# Patient Record
Sex: Female | Born: 1995 | Race: Black or African American | Hispanic: No | Marital: Single | Smoking: Never smoker
Health system: Southern US, Community
[De-identification: ages and names within clinical notes are randomized; demographics above are authoritative.]

## PROBLEM LIST (undated history)

## (undated) ENCOUNTER — Inpatient Hospital Stay (HOSPITAL_COMMUNITY): Payer: Self-pay

## (undated) DIAGNOSIS — J45909 Unspecified asthma, uncomplicated: Secondary | ICD-10-CM

## (undated) DIAGNOSIS — D571 Sickle-cell disease without crisis: Secondary | ICD-10-CM

## (undated) HISTORY — PX: NO PAST SURGERIES: SHX2092

---

## 2015-12-15 ENCOUNTER — Encounter (HOSPITAL_COMMUNITY): Payer: Self-pay | Admitting: Emergency Medicine

## 2015-12-15 DIAGNOSIS — D5700 Hb-SS disease with crisis, unspecified: Secondary | ICD-10-CM | POA: Diagnosis not present

## 2015-12-15 DIAGNOSIS — J45909 Unspecified asthma, uncomplicated: Secondary | ICD-10-CM | POA: Diagnosis not present

## 2015-12-15 DIAGNOSIS — R52 Pain, unspecified: Secondary | ICD-10-CM | POA: Diagnosis present

## 2015-12-15 LAB — CBC WITH DIFFERENTIAL/PLATELET
Basophils Absolute: 0 10*3/uL (ref 0.0–0.1)
Basophils Relative: 0 %
EOS PCT: 1 %
Eosinophils Absolute: 0.1 10*3/uL (ref 0.0–0.7)
HCT: 32.5 % — ABNORMAL LOW (ref 36.0–46.0)
Hemoglobin: 11 g/dL — ABNORMAL LOW (ref 12.0–15.0)
LYMPHS ABS: 3.8 10*3/uL (ref 0.7–4.0)
LYMPHS PCT: 27 %
MCH: 26.4 pg (ref 26.0–34.0)
MCHC: 33.8 g/dL (ref 30.0–36.0)
MCV: 77.9 fL — AB (ref 78.0–100.0)
MONO ABS: 1.2 10*3/uL — AB (ref 0.1–1.0)
MONOS PCT: 8 %
Neutro Abs: 9.2 10*3/uL — ABNORMAL HIGH (ref 1.7–7.7)
Neutrophils Relative %: 64 %
PLATELETS: 198 10*3/uL (ref 150–400)
RBC: 4.17 MIL/uL (ref 3.87–5.11)
RDW: 16.5 % — AB (ref 11.5–15.5)
WBC: 14.3 10*3/uL — ABNORMAL HIGH (ref 4.0–10.5)

## 2015-12-15 LAB — COMPREHENSIVE METABOLIC PANEL
ALT: 41 U/L (ref 14–54)
AST: 39 U/L (ref 15–41)
Albumin: 3.8 g/dL (ref 3.5–5.0)
Alkaline Phosphatase: 70 U/L (ref 38–126)
Anion gap: 7 (ref 5–15)
BUN: 8 mg/dL (ref 6–20)
CHLORIDE: 108 mmol/L (ref 101–111)
CO2: 23 mmol/L (ref 22–32)
Calcium: 9.1 mg/dL (ref 8.9–10.3)
Creatinine, Ser: 0.86 mg/dL (ref 0.44–1.00)
GFR calc Af Amer: >60 mL/min (ref >60)
GFR calc non Af Amer: >60 mL/min (ref >60)
Glucose, Bld: 118 mg/dL — ABNORMAL HIGH (ref 65–99)
POTASSIUM: 3.4 mmol/L — AB (ref 3.5–5.1)
Sodium: 138 mmol/L (ref 135–145)
Total Bilirubin: 1.4 mg/dL — ABNORMAL HIGH (ref 0.3–1.2)
Total Protein: 7.2 g/dL (ref 6.5–8.1)

## 2015-12-15 NOTE — ED Notes (Signed)
No answer for vital sign recheck. 

## 2015-12-15 NOTE — ED Notes (Addendum)
Pt reports sickle cell pain crisis resulting in generalized pain. with an onset last Tuesday. Pt was treated at [facility] last Wednesday with minimal relief. Pt alert x4. NAD at this time.

## 2015-12-16 ENCOUNTER — Emergency Department (HOSPITAL_COMMUNITY)
Admission: EM | Admit: 2015-12-16 | Discharge: 2015-12-16 | Disposition: A | Discharge status: Home / Self Care | Payer: BLUE CROSS/BLUE SHIELD | Attending: Emergency Medicine | Admitting: Emergency Medicine

## 2015-12-16 HISTORY — DX: Unspecified asthma, uncomplicated: J45.909

## 2015-12-16 HISTORY — DX: Sickle-cell disease without crisis: D57.1

## 2015-12-16 NOTE — ED Notes (Signed)
No answer for room placement.

## 2018-08-12 ENCOUNTER — Encounter (HOSPITAL_COMMUNITY): Payer: Self-pay | Admitting: *Deleted

## 2018-08-12 ENCOUNTER — Inpatient Hospital Stay (HOSPITAL_COMMUNITY)
Admission: AD | Admit: 2018-08-12 | Discharge: 2018-08-13 | Disposition: A | Discharge status: Home / Self Care | Payer: BC Managed Care – PPO | Source: Ambulatory Visit | Attending: Obstetrics and Gynecology | Admitting: Obstetrics and Gynecology

## 2018-08-12 DIAGNOSIS — R102 Pelvic and perineal pain: Secondary | ICD-10-CM | POA: Diagnosis present

## 2018-08-12 DIAGNOSIS — O26891 Other specified pregnancy related conditions, first trimester: Secondary | ICD-10-CM | POA: Insufficient documentation

## 2018-08-12 DIAGNOSIS — O209 Hemorrhage in early pregnancy, unspecified: Secondary | ICD-10-CM | POA: Insufficient documentation

## 2018-08-12 DIAGNOSIS — Z3A08 8 weeks gestation of pregnancy: Secondary | ICD-10-CM | POA: Diagnosis not present

## 2018-08-12 LAB — POCT PREGNANCY, URINE: PREG TEST UR: POSITIVE — AB

## 2018-08-12 NOTE — MAU Note (Signed)
Cramping and bleeding since yesterday. Worse tonight. Feels like period cramps.

## 2018-08-13 ENCOUNTER — Inpatient Hospital Stay (HOSPITAL_COMMUNITY): Payer: BC Managed Care – PPO

## 2018-08-13 LAB — URINALYSIS, ROUTINE W REFLEX MICROSCOPIC
BACTERIA UA: NONE SEEN
Bilirubin Urine: NEGATIVE
Glucose, UA: NEGATIVE mg/dL
KETONES UR: NEGATIVE mg/dL
LEUKOCYTES UA: NEGATIVE
Nitrite: NEGATIVE
PROTEIN: NEGATIVE mg/dL
Specific Gravity, Urine: 1.006 (ref 1.005–1.030)
pH: 6 (ref 5.0–8.0)

## 2018-08-13 LAB — ABO/RH: ABO/RH(D): O POS

## 2018-08-13 MED ORDER — HYDROMORPHONE HCL 1 MG/ML IJ SOLN
1.0000 mg | Freq: Once | INTRAMUSCULAR | Status: AC
  Administered 2018-08-13: 1 mg via INTRAMUSCULAR
  Filled 2018-08-13: qty 1

## 2018-08-13 NOTE — Progress Notes (Signed)
Written and verbal d/c instructions given and understanding voiced. 

## 2018-08-13 NOTE — MAU Provider Note (Signed)
History     CSN: [id number]  Arrival date and time: 08/12/18 2304   First Provider Initiated Contact with Patient 08/13/18 0007      Chief Complaint  Patient presents with  . Abdominal Pain  . Vaginal Bleeding   HPI   [name] is a 22 y.o. female G1P0 @ 6061w1d by first trimester US here with abdominal pain and vaginal bleeding. Says she thinks her period has started because it looks and feels like that. The bleeding started today. The blood is bright red. Initially the bleeding was noted only when she wipes, now the bleeding is on a pad. She has lower abdominal cramping that comes and goes.   OB History    Gravida  1   Para      Term      Preterm      AB      Living        SAB      TAB      Ectopic      Multiple      Live Births              Past Medical History:  Diagnosis Date  . Asthma   . Sickle cell disease (HCC)     Past Surgical History:  Procedure Laterality Date  . NO PAST SURGERIES      History reviewed. No pertinent family history.  Social History   Tobacco Use  . Smoking status: Never Smoker  . Smokeless tobacco: Never Used  Substance Use Topics  . Alcohol use: No  . Drug use: No    Allergies: No Known Allergies  Medications Prior to Admission  Medication Sig Dispense Refill Last Dose  . Prenatal Vit-Fe Fumarate-FA (PRENATAL MULTIVITAMIN) TABS tablet Take 1 tablet by mouth daily at 12 noon.   08/11/2018 at Unknown time   Results for orders placed or performed during the hospital encounter of 08/12/18 (from the past 48 hour(s))  Urinalysis, Routine w reflex microscopic     Status: Abnormal   Collection Time: 08/12/18 11:51 PM  Result Value Ref Range   Color, Urine STRAW (A) YELLOW   APPearance CLEAR CLEAR   Specific Gravity, Urine 1.006 1.005 - 1.030   pH 6.0 5.0 - 8.0   Glucose, UA NEGATIVE NEGATIVE mg/dL   Hgb urine dipstick LARGE (A) NEGATIVE   Bilirubin Urine NEGATIVE NEGATIVE   Ketones, ur NEGATIVE  NEGATIVE mg/dL   Protein, ur NEGATIVE NEGATIVE mg/dL   Nitrite NEGATIVE NEGATIVE   Leukocytes, UA NEGATIVE NEGATIVE   RBC / HPF 0-5 0 - 5 RBC/hpf   Bacteria, UA NONE SEEN NONE SEEN   Mucus PRESENT     Comment: Performed at [facility], [address] [place], [place] [address]  Pregnancy, urine POC     Status: Abnormal   Collection Time: 08/12/18 11:54 PM  Result Value Ref Range   Preg Test, Ur POSITIVE (A) NEGATIVE    Comment:        THE SENSITIVITY OF THIS METHODOLOGY IS >24 mIU/mL    [place] Ob Less Than 14 Weeks With Ob Transvaginal  Result Date: 08/13/2018 CLINICAL DATA:  Acute onset of vaginal bleeding and pelvic cramping. EXAM: OBSTETRIC <14 WK US AND TRANSVAGINAL OB US TECHNIQUE: Both transabdominal and transvaginal ultrasound examinations were performed for complete evaluation of the gestation as well as the maternal uterus, adnexal regions, and pelvic cul-de-sac. Transvaginal technique was performed to assess early pregnancy. COMPARISON:  None. FINDINGS: Intrauterine gestational  sac: Single; visualized and normal in shape. Yolk sac:  Yes Embryo:  Yes Cardiac Activity: Yes Heart Rate: 171 bpm CRL: 18.6  mm   8 w   2 d                  US EDC: 03/23/2019 Subchorionic hemorrhage:  None visualized. Maternal uterus/adnexae: The uterus is otherwise unremarkable in appearance. The ovaries are within normal limits. There is no evidence for ovarian torsion. No suspicious adnexal masses are seen. A small amount of free fluid is seen within the pelvic cul-de-sac. IMPRESSION: Single live intrauterine pregnancy, with a crown-rump length of 1.9 cm, corresponding to a gestational age of [redacted] weeks 2 days. This matches the gestational age of [redacted] weeks 1 day by prior ultrasound, reflecting an estimated date of delivery of March 24, 2019. Electronically Signed   By: Roanna RaiderJeffery  Chang M.D.   On: 08/13/2018 01:29    Review of Systems  Constitutional: Negative for fever.  Gastrointestinal: Positive for  abdominal pain.  Genitourinary: Positive for vaginal bleeding. Negative for dysuria.   Physical Exam   Blood pressure (!) 116/52, pulse 89, temperature 98.3 F (36.8 C), resp. rate 18, height 5\' 3"  (1.6 m), weight 101.2 kg, last menstrual period 06/16/2018.  Physical Exam  Constitutional: She is oriented to person, place, and time. She appears well-developed and well-nourished. No distress.  GI: Soft. She exhibits no distension and no mass. There is no abdominal tenderness. There is no rebound and no guarding.  Genitourinary:    Genitourinary Comments: Cervix: Closed, posterior. Small amount of dark red blood noted on pad.    Musculoskeletal: Normal range of motion.  Neurological: She is alert and oriented to person, place, and time.  Skin: Skin is warm. She is not diaphoretic.  Psychiatric: Her behavior is normal.   MAU Course  Procedures  None  MDM  Attempted bedside US: unable to determine fetal heart rate due to body habitus.  Dilaudid given for pain IM  US ordered: + fetal heart rate  Discussed US results in detail ABO: O positive   Assessment and Plan   A:  1. Vaginal bleeding in pregnancy, first trimester     P:  Discharge home in stable condition Bleeding with unknown cause.  Pelvic rest Return to MAU if symptoms worsen Ok to use tylenol as directed on the bottle.   Duane Lopeasch, Aum Caggiano I, NP 08/14/2018 9:39 AM

## 2018-08-13 NOTE — Discharge Instructions (Signed)
Vaginal Bleeding During Pregnancy, First Trimester ° °A small amount of bleeding from the vagina (spotting) is relatively common during early pregnancy. It usually stops on its own. Various things may cause bleeding or spotting during early pregnancy. Some bleeding may be related to the pregnancy, and some may not. In many cases, the bleeding is normal and is not a problem. However, bleeding can also be a sign of something serious. Be sure to tell your health care provider about any vaginal bleeding right away. °Some possible causes of vaginal bleeding during the first trimester include: °· Infection or inflammation of the cervix. °· Growths (polyps) on the cervix. °· Miscarriage or threatened miscarriage. °· Pregnancy tissue developing outside of the uterus (ectopic pregnancy). °· A mass of tissue developing in the uterus due to an egg being fertilized incorrectly (molar pregnancy). °Follow these instructions at home: °Activity °· Follow instructions from your health care provider about limiting your activity. Ask what activities are safe for you. °· If needed, make plans for someone to help with your regular activities. °· Do not have sex or orgasms until your health care provider says that this is safe. °General instructions °· Take over-the-counter and prescription medicines only as told by your health care provider. °· Pay attention to any changes in your symptoms. °· Do not use tampons or douche. °· Write down how many pads you use each day, how often you change pads, and how soaked (saturated) they are. °· If you pass any tissue from your vagina, save the tissue so you can show it to your health care provider. °· Keep all follow-up visits as told by your health care provider. This is important. °Contact a health care provider if: °· You have vaginal bleeding during any part of your pregnancy. °· You have cramps or labor pains. °· You have a fever. °Get help right away if: °· You have severe cramps in your  back or abdomen. °· You pass large clots or a large amount of tissue from your vagina. °· Your bleeding increases. °· You feel light-headed or weak, or you faint. °· You have chills. °· You are leaking fluid or have a gush of fluid from your vagina. °Summary °· A small amount of bleeding (spotting) from the vagina is relatively common during early pregnancy. °· Various things may cause bleeding or spotting in early pregnancy. °· Be sure to tell your health care provider about any vaginal bleeding right away. °This information is not intended to replace advice given to you by your health care provider. Make sure you discuss any questions you have with your health care provider. °Document Released: 05/19/2005 Document Revised: 11/11/2016 Document Reviewed: 11/11/2016 °Elsevier Interactive Patient Education © 2019 [employer] Inc. ° °

## 2018-08-20 ENCOUNTER — Inpatient Hospital Stay (HOSPITAL_COMMUNITY)
Admission: AD | Admit: 2018-08-20 | Discharge: 2018-08-20 | Disposition: A | Discharge status: Home / Self Care | Payer: BC Managed Care – PPO | Source: Ambulatory Visit | Attending: Obstetrics and Gynecology | Admitting: Obstetrics and Gynecology

## 2018-08-20 DIAGNOSIS — M545 Low back pain: Secondary | ICD-10-CM | POA: Diagnosis not present

## 2018-08-20 DIAGNOSIS — O209 Hemorrhage in early pregnancy, unspecified: Secondary | ICD-10-CM | POA: Diagnosis not present

## 2018-08-20 DIAGNOSIS — O99511 Diseases of the respiratory system complicating pregnancy, first trimester: Secondary | ICD-10-CM | POA: Diagnosis not present

## 2018-08-20 DIAGNOSIS — O99011 Anemia complicating pregnancy, first trimester: Secondary | ICD-10-CM | POA: Insufficient documentation

## 2018-08-20 DIAGNOSIS — D571 Sickle-cell disease without crisis: Secondary | ICD-10-CM | POA: Insufficient documentation

## 2018-08-20 DIAGNOSIS — J45909 Unspecified asthma, uncomplicated: Secondary | ICD-10-CM | POA: Diagnosis not present

## 2018-08-20 DIAGNOSIS — Z3A09 9 weeks gestation of pregnancy: Secondary | ICD-10-CM | POA: Diagnosis not present

## 2018-08-20 DIAGNOSIS — O9989 Other specified diseases and conditions complicating pregnancy, childbirth and the puerperium: Secondary | ICD-10-CM | POA: Diagnosis not present

## 2018-08-20 DIAGNOSIS — O468X1 Other antepartum hemorrhage, first trimester: Secondary | ICD-10-CM

## 2018-08-20 DIAGNOSIS — O418X10 Other specified disorders of amniotic fluid and membranes, first trimester, not applicable or unspecified: Secondary | ICD-10-CM | POA: Diagnosis not present

## 2018-08-20 DIAGNOSIS — O208 Other hemorrhage in early pregnancy: Secondary | ICD-10-CM | POA: Diagnosis not present

## 2018-08-20 DIAGNOSIS — O26891 Other specified pregnancy related conditions, first trimester: Secondary | ICD-10-CM | POA: Diagnosis not present

## 2018-08-20 DIAGNOSIS — O99891 Other specified diseases and conditions complicating pregnancy: Secondary | ICD-10-CM

## 2018-08-20 DIAGNOSIS — M549 Dorsalgia, unspecified: Secondary | ICD-10-CM

## 2018-08-20 LAB — URINALYSIS, ROUTINE W REFLEX MICROSCOPIC
BILIRUBIN URINE: NEGATIVE
Bacteria, UA: NONE SEEN
Glucose, UA: NEGATIVE mg/dL
KETONES UR: NEGATIVE mg/dL
LEUKOCYTES UA: NEGATIVE
NITRITE: NEGATIVE
Protein, ur: NEGATIVE mg/dL
Specific Gravity, Urine: 1.014 (ref 1.005–1.030)
pH: 5 (ref 5.0–8.0)

## 2018-08-20 MED ORDER — ACETAMINOPHEN 500 MG PO TABS
1000.0000 mg | ORAL_TABLET | Freq: Once | ORAL | Status: AC
  Administered 2018-08-20: 1000 mg via ORAL
  Filled 2018-08-20: qty 2

## 2018-08-20 MED ORDER — ACETAMINOPHEN 500 MG PO TABS: 1000.0000 mg | ORAL_TABLET | Freq: Four times a day (QID) | ORAL | 0 refills | Status: AC | PRN

## 2018-08-20 NOTE — MAU Note (Signed)
Ongoing bleeding and cramping. States she passed a blood clot.

## 2018-08-20 NOTE — Discharge Instructions (Signed)
Subchorionic Hematoma ° °A subchorionic hematoma is a gathering of blood between the outer wall of the embryo (chorion) and the inner wall of the womb (uterus). °This condition can cause vaginal bleeding. If they cause little or no vaginal bleeding, early small hematomas usually shrink on their own and do not affect your baby or pregnancy. When bleeding starts later in pregnancy, or if the hematoma is larger or occurs in older pregnant women, the condition may be more serious. Larger hematomas may get bigger, which increases the chances of miscarriage. This condition also increases the risk of: °· Premature separation of the placenta from the uterus. °· Premature (preterm) labor. °· Stillbirth. °What are the causes? °The exact cause of this condition is not known. It occurs when blood is trapped between the placenta and the uterine wall because the placenta has separated from the original site of implantation. °What increases the risk? °You are more likely to develop this condition if: °· You were treated with fertility medicines. °· You conceived through in vitro fertilization (IVF). °What are the signs or symptoms? °Symptoms of this condition include: °· Vaginal spotting or bleeding. °· Contractions of the uterus. These cause abdominal pain. °Sometimes you may have no symptoms and the bleeding may only be seen when ultrasound images are taken (transvaginal ultrasound). °How is this diagnosed? °This condition is diagnosed based on a physical exam. This includes a pelvic exam. You may also have other tests, including: °· Blood tests. °· Urine tests. °· Ultrasound of the abdomen. °How is this treated? °Treatment for this condition can vary. Treatment may include: °· Watchful waiting. You will be monitored closely for any changes in bleeding. During this stage: °? The hematoma may be reabsorbed by the body. °? The hematoma may separate the fluid-filled space containing the embryo (gestational sac) from the wall of the  womb (endometrium). °· Medicines. °· Activity restriction. This may be needed until the bleeding stops. °Follow these instructions at home: °· Stay on bed rest if told to do so by your health care provider. °· Do not lift anything that is heavier than 10 lbs. (4.5 kg) or as told by your health care provider. °· Do not use any products that contain nicotine or tobacco, such as cigarettes and e-cigarettes. If you need help quitting, ask your health care provider. °· Track and write down the number of pads you use each day and how soaked (saturated) they are. °· Do not use tampons. °· Keep all follow-up visits as told by your health care provider. This is important. Your health care provider may ask you to have follow-up blood tests or ultrasound tests or both. °Contact a health care provider if: °· You have any vaginal bleeding. °· You have a fever. °Get help right away if: °· You have severe cramps in your stomach, back, abdomen, or pelvis. °· You pass large clots or tissue. Save any tissue for your health care provider to look at. °· You have more vaginal bleeding, and you faint or become lightheaded or weak. °Summary °· A subchorionic hematoma is a gathering of blood between the outer wall of the placenta and the uterus. °· This condition can cause vaginal bleeding. °· Sometimes you may have no symptoms and the bleeding may only be seen when ultrasound images are taken. °· Treatment may include watchful waiting, medicines, or activity restriction. °This information is not intended to replace advice given to you by your health care provider. Make sure you discuss any questions you   have with your health care provider. °Document Released: 11/24/2006 Document Revised: 10/05/2016 Document Reviewed: 10/05/2016 °Elsevier Interactive Patient Education © 2019 Elsevier Inc. ° °

## 2018-08-20 NOTE — MAU Provider Note (Signed)
Chief Complaint: Back Pain and Vaginal Bleeding   First Provider Initiated Contact with Patient 08/20/18 1310     SUBJECTIVE HPI: Angelica Fischer is a 22 y.o. G1P0 at [redacted]w[redacted]d who presents to Maternity Admissions reporting Increased vaginal bleeding, passing a clot and severe low back pain since this morning. Has had two prior formal US's showing live IUP. Started prenatal care at [facility] in [place].   Low Back Pain Location: Across low back Quality: cramping Severity: 9/10 on pain scale Duration: ~6 hours Context: early pregnancy. IUP previously verified Timing: intermittent  Modifying factors: None. Hasn't tried anything Associated signs and symptoms: Pos for VB, passage of small clot. Does not think she passed tissue. Neg for fever, chills, urinary complaints, GI complaints, flank pain, dizziness.   O POS Performed at [facility], [address], [place], [place] [address]    Past Medical History:  Diagnosis Date  . Asthma   . Sickle cell disease (HCC)    OB History  Gravida Para Term Preterm AB Living  1            SAB TAB Ectopic Multiple Live Births               # Outcome Date GA Lbr Len/2nd Weight Sex Delivery Anes PTL Lv  1 Current            Past Surgical History:  Procedure Laterality Date  . NO PAST SURGERIES     Social History   Socioeconomic History  . Marital status: Single    Spouse name: Not on file  . Number of children: Not on file  . Years of education: Not on file  . Highest education level: Not on file  Occupational History  . Not on file  Social Needs  . Financial resource strain: Not on file  . Food insecurity:    Worry: Not on file    Inability: Not on file  . Transportation needs:    Medical: Not on file    Non-medical: Not on file  Tobacco Use  . Smoking status: Never Smoker  . Smokeless tobacco: Never Used  Substance and Sexual Activity  . Alcohol use: No  . Drug use: No  . Sexual activity: Yes    Birth  control/protection: None  Lifestyle  . Physical activity:    Days per week: Not on file    Minutes per session: Not on file  . Stress: Not on file  Relationships  . Social connections:    Talks on phone: Not on file    Gets together: Not on file    Attends religious service: Not on file    Active member of club or organization: Not on file    Attends meetings of clubs or organizations: Not on file    Relationship status: Not on file  . Intimate partner violence:    Fear of current or ex partner: Not on file    Emotionally abused: Not on file    Physically abused: Not on file    Forced sexual activity: Not on file  Other Topics Concern  . Not on file  Social History Narrative  . Not on file   No family history on file. No current facility-administered medications on file prior to encounter.    Current Outpatient Medications on File Prior to Encounter  Medication Sig Dispense Refill  . Prenatal Vit-Fe Fumarate-FA (PRENATAL MULTIVITAMIN) TABS tablet Take 1 tablet by mouth daily at 12 noon.     No  Known Allergies  I have reviewed patient's Past Medical Hx, Surgical Hx, Family Hx, Social Hx, medications and allergies.   Review of Systems  Constitutional: Negative for chills and fever.  Gastrointestinal: Negative for abdominal pain, constipation, diarrhea, nausea and vomiting.  Genitourinary: Positive for pelvic pain and vaginal bleeding. Negative for difficulty urinating, dysuria, flank pain, frequency, hematuria, vaginal discharge and vaginal pain.  Musculoskeletal: Positive for back pain. Negative for myalgias.  Neurological: Negative for dizziness.    OBJECTIVE Patient Vitals for the past 24 hrs:  BP Temp Temp src Pulse Resp Height Weight  08/20/18 1150 (!) 129/51 99.1 F (37.3 C) Oral (!) 107 18 5\' 3"  (1.6 m) 88.2 kg   Constitutional: Well-developed, well-nourished female in no acute distress.  Cardiovascular: normal rate Respiratory: normal rate and effort.  GI:  Abd soft, non-tender. MS: Extremities nontender, no edema, normal ROM Neurologic: Alert and oriented x 4.  GU: Neg CVAT.  BIMANUAL EXAM: NEFG, physiologic discharge, scant dark red blood noted, cervix closed uterus 10-week-size, no adnexal tenderness or masses.  No CMT.  LAB RESULTS Results for orders placed or performed during the hospital encounter of 08/20/18 (from the past 24 hour(s))  Urinalysis, Routine w reflex microscopic     Status: Abnormal   Collection Time: 08/20/18 12:11 PM  Result Value Ref Range   Color, Urine YELLOW YELLOW   APPearance CLEAR CLEAR   Specific Gravity, Urine 1.014 1.005 - 1.030   pH 5.0 5.0 - 8.0   Glucose, UA NEGATIVE NEGATIVE mg/dL   Hgb urine dipstick LARGE (A) NEGATIVE   Bilirubin Urine NEGATIVE NEGATIVE   Ketones, ur NEGATIVE NEGATIVE mg/dL   Protein, ur NEGATIVE NEGATIVE mg/dL   Nitrite NEGATIVE NEGATIVE   Leukocytes, UA NEGATIVE NEGATIVE   RBC / HPF 11-20 0 - 5 RBC/hpf   WBC, UA 0-5 0 - 5 WBC/hpf   Bacteria, UA NONE SEEN NONE SEEN   Squamous Epithelial / LPF 0-5 0 - 5   Mucus PRESENT     IMAGING Informal BS US shows active live SIUP, FHR 169. CRL equivalent to 9.1 weeks. [facility].   MAU COURSE Orders Placed This Encounter  Procedures  . Urinalysis, Routine w reflex microscopic  . Discharge patient   Meds ordered this encounter  Medications  . acetaminophen (TYLENOL) tablet 1,000 mg  . acetaminophen (TYLENOL) 500 MG tablet    Sig: Take 2 tablets (1,000 mg total) by mouth every 6 (six) hours as needed.    Dispense:  30 tablet    Refill:  0    Order Specific Question:   Supervising Provider    Answer:   Conan BowensDAVIS, KELLY M [[id number]]   Pain decreased significantly. Pt reassured.   MDM - Vaginal bleeding and low back pain w/ live IUP. [facility]x. [facility] from [facility]. LBP not C/W CVAT.   ASSESSMENT 1. Subchorionic hemorrhage of placenta in first trimester, single or unspecified fetus   2. Vaginal bleeding in pregnancy, first trimester   3. Back  pain affecting pregnancy in first trimester     PLAN Discharge home in stable condition. Bleeding/SAB precautions Tylenol, heat for back pain.  Declined repeat wet prep, cultures.  Follow-up Information    Eulis FosterQuinn, Kristen, MD Follow up.   Specialty:  Obstetrics and Gynecology Why:  as scheduled for OB visit or sooner as needed if symptoms worsen Contact information: [address] [address] [place] [id number] [phone]        [facility] Follow up.  Specialty:  Obstetrics and Gynecology Why:  as needed in pregnancy emergencies Contact information: [address] [id number] [place] [id number] [phone]         Allergies as of 08/20/2018   No Known Allergies     Medication List    TAKE these medications   acetaminophen 500 MG tablet Commonly known as:  TYLENOL Take 2 tablets (1,000 mg total) by mouth every 6 (six) hours as needed.   prenatal multivitamin Tabs tablet Take 1 tablet by mouth daily at 12 noon.        Katrinka BlazingSmith, [place], CNM 08/20/2018  2:27 PM

## 2019-06-16 ENCOUNTER — Encounter (HOSPITAL_COMMUNITY): Payer: Self-pay

## 2019-11-14 IMAGING — US US OB < 14 WEEKS - US OB TV
1 series · 15 of 28 positions shown · non-contrast
Comparison: None.

CLINICAL DATA: Acute onset of vaginal bleeding and pelvic cramping.

EXAM:
OBSTETRIC <14 WK US AND TRANSVAGINAL OB US
TECHNIQUE: Both transabdominal and transvaginal ultrasound examinations were
performed for complete evaluation of the gestation as well as the
maternal uterus, adnexal regions, and pelvic cul-de-sac.
Transvaginal technique was performed to assess early pregnancy.

[Series 1: us ob < 14 weeks - us ob tv · 15 of 36 slices shown]
[im 1/36]
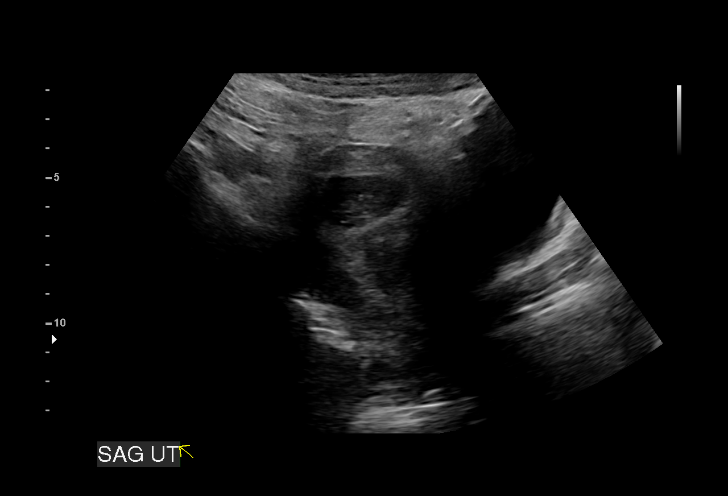
[im 3/36]
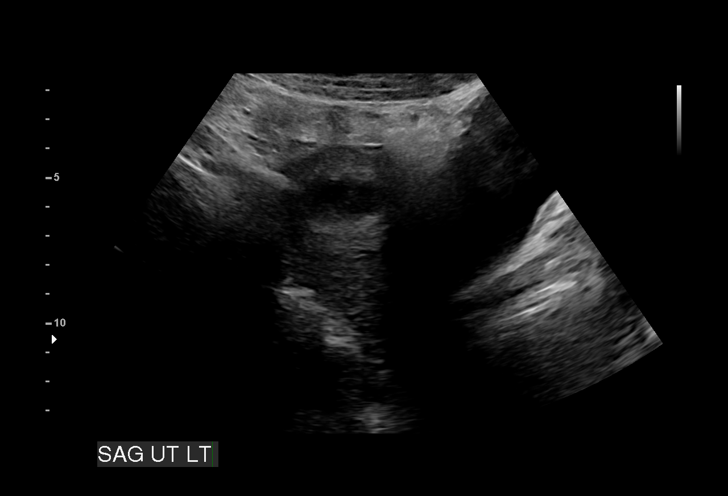
[im 6/36]
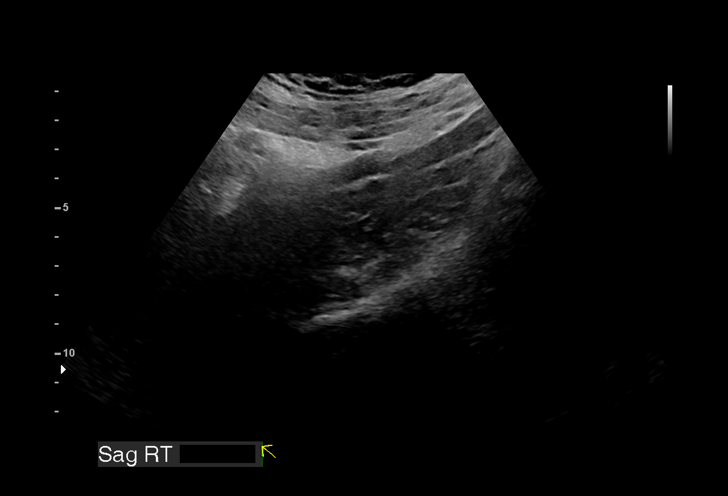
[im 8/36]
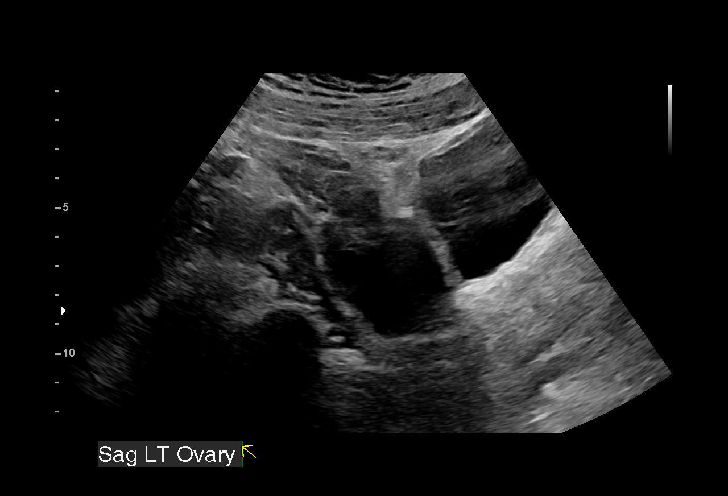
[im 11/36]
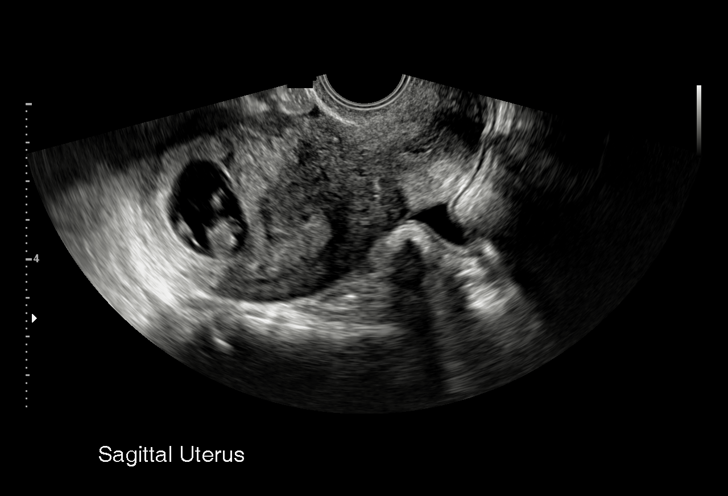
[im 13/36]
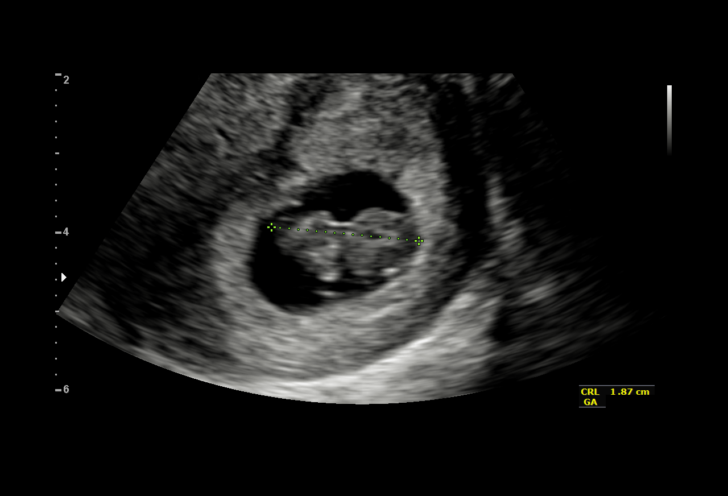
[im 16/36]
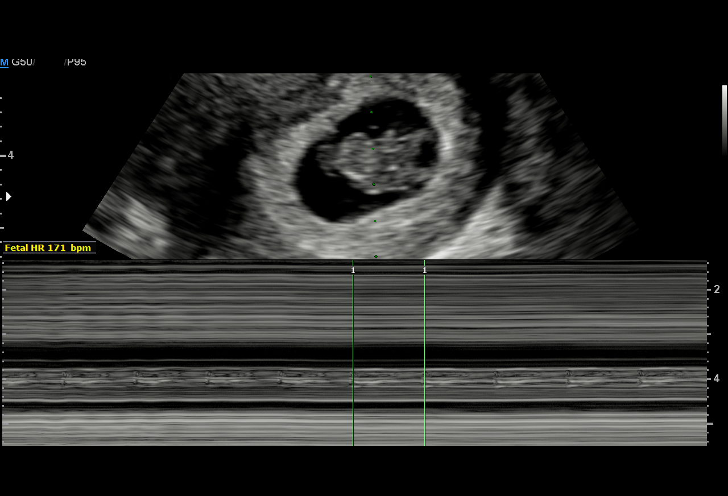
[im 19/36]
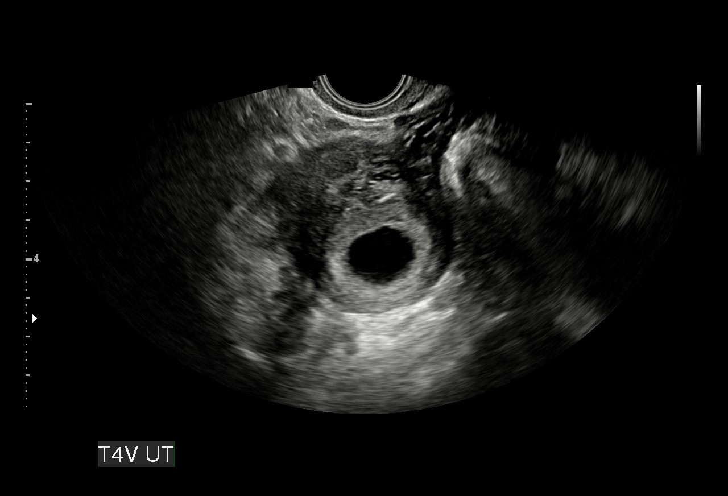
[im 20/36]
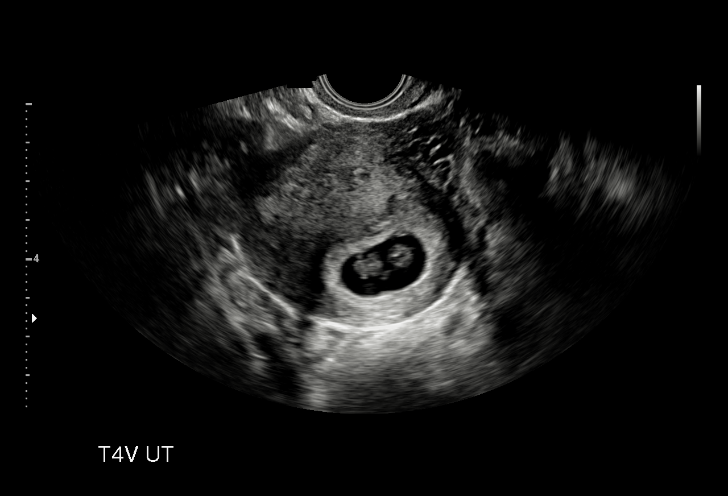
[im 23/36]
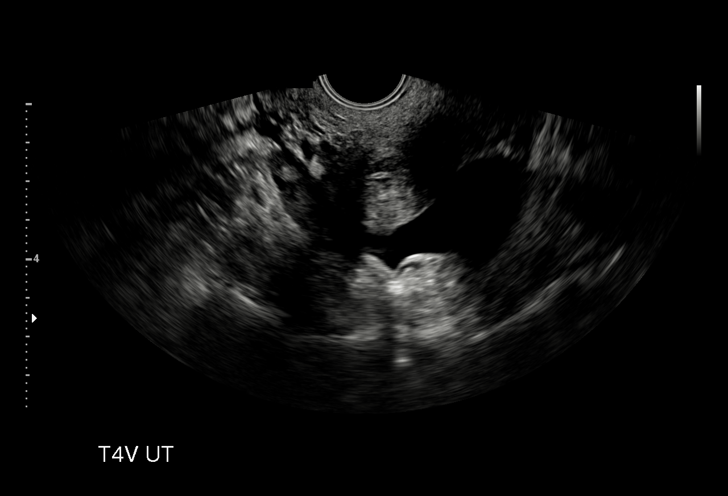
[im 25/36]
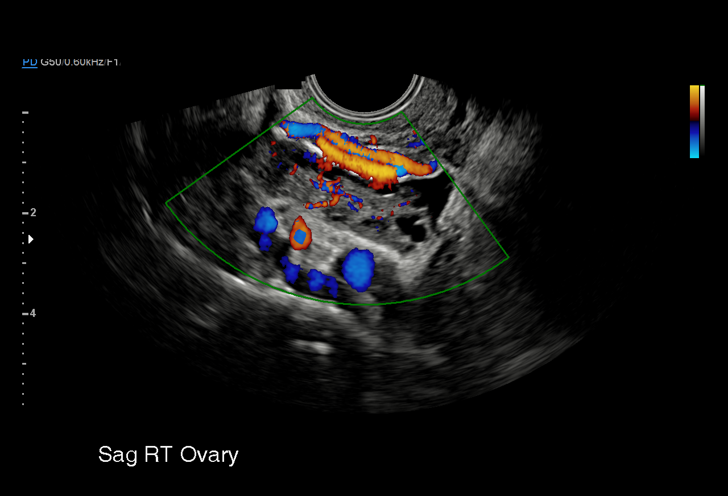
[im 28/36]
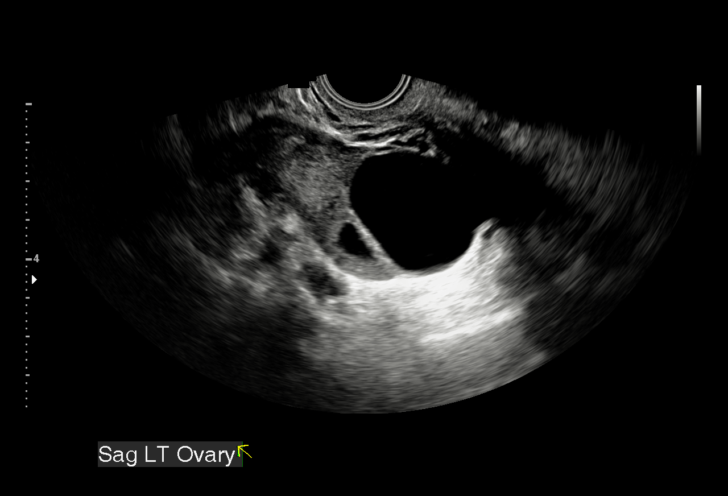
[im 30/36]
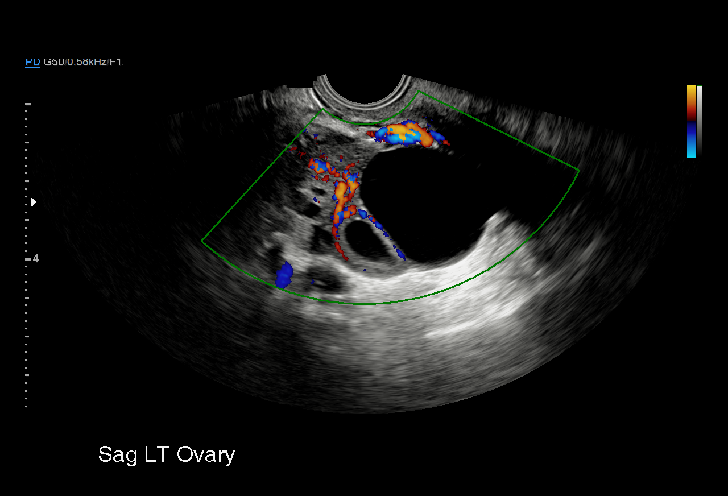
[im 33/36]
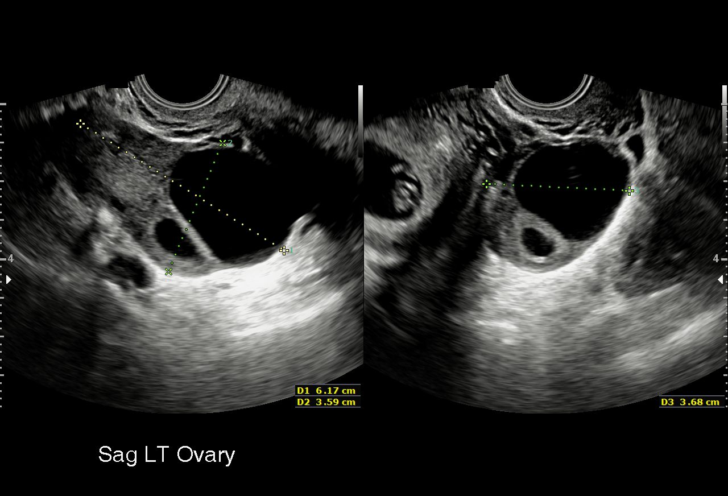
[im 36/36]
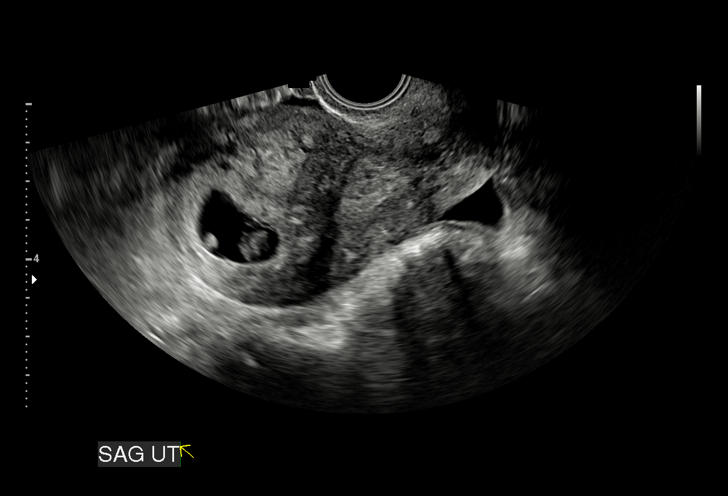

[15 of 28 positions shown; findings below may reference images not displayed]

FINDINGS: Intrauterine gestational sac: Single; visualized and normal in
shape.

Yolk sac:  Yes

Embryo:  Yes

Cardiac Activity: Yes

Heart Rate: 171 bpm

CRL: 18.6  mm   8 w   2 d                  US EDC: 03/23/2019

Subchorionic hemorrhage:  None visualized.

Maternal uterus/adnexae: The uterus is otherwise unremarkable in
appearance. The ovaries are within normal limits. There is no
evidence for ovarian torsion. No suspicious adnexal masses are seen.

A small amount of free fluid is seen within the pelvic cul-de-sac.
IMPRESSION: Single live intrauterine pregnancy, with a crown-rump length of
cm, corresponding to a gestational age of 8 weeks 2 days. This
matches the gestational age of 8 weeks 1 day by prior ultrasound,
reflecting an estimated date of delivery March 24, 2019.

## 2022-10-13 ENCOUNTER — Emergency Department (HOSPITAL_BASED_OUTPATIENT_CLINIC_OR_DEPARTMENT_OTHER)
Admission: EM | Admit: 2022-10-13 | Discharge: 2022-10-13 | Disposition: A | Discharge status: Home / Self Care | Payer: BC Managed Care – PPO | Attending: Emergency Medicine | Admitting: Emergency Medicine

## 2022-10-13 ENCOUNTER — Encounter (HOSPITAL_BASED_OUTPATIENT_CLINIC_OR_DEPARTMENT_OTHER): Payer: Self-pay | Admitting: Radiology

## 2022-10-13 ENCOUNTER — Other Ambulatory Visit: Payer: Self-pay

## 2022-10-13 DIAGNOSIS — R519 Headache, unspecified: Secondary | ICD-10-CM | POA: Diagnosis not present

## 2022-10-13 DIAGNOSIS — Z3A19 19 weeks gestation of pregnancy: Secondary | ICD-10-CM | POA: Diagnosis not present

## 2022-10-13 DIAGNOSIS — O26892 Other specified pregnancy related conditions, second trimester: Secondary | ICD-10-CM | POA: Diagnosis not present

## 2022-10-13 LAB — RETICULOCYTES
Immature Retic Fract: 37.6 % — ABNORMAL HIGH (ref 2.3–15.9)
RBC.: 3.47 MIL/uL — ABNORMAL LOW (ref 3.87–5.11)
Retic Count, Absolute: 217.9 10*3/uL — ABNORMAL HIGH (ref 19.0–186.0)
Retic Ct Pct: 6.3 % — ABNORMAL HIGH (ref 0.4–3.1)

## 2022-10-13 LAB — CBC
HCT: 28.5 % — ABNORMAL LOW (ref 36.0–46.0)
Hemoglobin: 10.2 g/dL — ABNORMAL LOW (ref 12.0–15.0)
MCH: 29.1 pg (ref 26.0–34.0)
MCHC: 35.8 g/dL (ref 30.0–36.0)
MCV: 81.4 fL (ref 80.0–100.0)
Platelets: 199 10*3/uL (ref 150–400)
RBC: 3.5 MIL/uL — ABNORMAL LOW (ref 3.87–5.11)
RDW: 15.6 % — ABNORMAL HIGH (ref 11.5–15.5)
WBC: 11.5 10*3/uL — ABNORMAL HIGH (ref 4.0–10.5)
nRBC: 0.3 % — ABNORMAL HIGH (ref 0.0–0.2)

## 2022-10-13 LAB — BASIC METABOLIC PANEL
Anion gap: 5 (ref 5–15)
BUN: 7 mg/dL (ref 6–20)
CO2: 23 mmol/L (ref 22–32)
Calcium: 8.5 mg/dL — ABNORMAL LOW (ref 8.9–10.3)
Chloride: 105 mmol/L (ref 98–111)
Creatinine, Ser: 0.71 mg/dL (ref 0.44–1.00)
GFR, Estimated: >60 mL/min (ref >60)
Glucose, Bld: 89 mg/dL (ref 70–99)
Potassium: 3.6 mmol/L (ref 3.5–5.1)
Sodium: 133 mmol/L — ABNORMAL LOW (ref 135–145)

## 2022-10-13 LAB — URINALYSIS, ROUTINE W REFLEX MICROSCOPIC
Bilirubin Urine: NEGATIVE
Glucose, UA: NEGATIVE mg/dL
Hgb urine dipstick: NEGATIVE
Ketones, ur: NEGATIVE mg/dL
Leukocytes,Ua: NEGATIVE
Nitrite: NEGATIVE
Protein, ur: NEGATIVE mg/dL
Specific Gravity, Urine: 1.02 (ref 1.005–1.030)
pH: 6 (ref 5.0–8.0)

## 2022-10-13 MED ORDER — LACTATED RINGERS IV BOLUS
1000.0000 mL | Freq: Once | INTRAVENOUS | Status: AC
  Administered 2022-10-13: 1000 mL via INTRAVENOUS

## 2022-10-13 MED ORDER — DIPHENHYDRAMINE HCL 50 MG/ML IJ SOLN
50.0000 mg | Freq: Once | INTRAMUSCULAR | Status: AC
  Administered 2022-10-13: 50 mg via INTRAVENOUS
  Filled 2022-10-13: qty 1

## 2022-10-13 MED ORDER — METOCLOPRAMIDE HCL 5 MG/ML IJ SOLN
10.0000 mg | Freq: Once | INTRAMUSCULAR | Status: AC
  Administered 2022-10-13: 10 mg via INTRAVENOUS
  Filled 2022-10-13: qty 2

## 2022-10-13 NOTE — ED Triage Notes (Signed)
Pt is [redacted] weeks pregnant and has had a headache for the past three days as well as pain with urination. Pt states she is also a sickle cell patients so she is unsure if this is related.

## 2022-10-13 NOTE — ED Provider Notes (Signed)
[facility] Provider Note   CSN: [id number] Arrival date & time: 10/13/22  1621     History Chief Complaint  Patient presents with   Headache    HPI Angelica Fischer is a 27 y.o. female presenting for headaches.  She is a 27 year old female with sickle cell trait who is [redacted] weeks pregnant.  She states that she started having severe headache earlier today.  History of similar but has had medication limitations in the setting of her recent pregnancy.  She is not taking Percocet as she normally would.  She has been doing well during this pregnancy.  Denies fevers chills nausea vomiting syncope shortness of breath.  She is otherwise ambulatory tolerating p.o. intake..   Patient's recorded medical, surgical, social, medication list and allergies were reviewed in the Snapshot window as part of the initial history.   Review of Systems   Review of Systems  Constitutional:  Negative for chills and fever.  HENT:  Negative for ear pain and sore throat.   Eyes:  Negative for pain and visual disturbance.  Respiratory:  Negative for cough and shortness of breath.   Cardiovascular:  Negative for chest pain and palpitations.  Gastrointestinal:  Negative for abdominal pain and vomiting.  Genitourinary:  Negative for dysuria and hematuria.  Musculoskeletal:  Negative for arthralgias and back pain.  Skin:  Negative for color change and rash.  Neurological:  Positive for headaches. Negative for seizures and syncope.  All other systems reviewed and are negative.   Physical Exam Updated Vital Signs BP (!) 114/58   Pulse 98   Temp 98.5 F (36.9 C)   Resp 16   Ht 5' 3"$  (1.6 m)   Wt 99.8 kg   SpO2 100%   BMI 38.97 kg/m  Physical Exam Vitals and nursing note reviewed.  Constitutional:      General: She is not in acute distress.    Appearance: She is well-developed.  HENT:     Head: Normocephalic and atraumatic.  Eyes:     Conjunctiva/sclera:  Conjunctivae normal.  Cardiovascular:     Rate and Rhythm: Normal rate and regular rhythm.     Heart sounds: No murmur heard. Pulmonary:     Effort: Pulmonary effort is normal. No respiratory distress.     Breath sounds: Normal breath sounds.  Abdominal:     General: There is no distension.     Palpations: Abdomen is soft.     Tenderness: There is no abdominal tenderness. There is no right CVA tenderness or left CVA tenderness.  Musculoskeletal:        General: No swelling or tenderness. Normal range of motion.     Cervical back: Neck supple.  Skin:    General: Skin is warm and dry.  Neurological:     General: No focal deficit present.     Mental Status: She is alert and oriented to person, place, and time. Mental status is at baseline.     Cranial Nerves: No cranial nerve deficit.      ED Course/ Medical Decision Making/ A&P    Procedures Ultrasound ED OB Pelvic  Date/Time: 10/13/2022 10:30 PM  Performed by: Tretha Sciara, MD Authorized by: Tretha Sciara, MD   Procedure details:    Indications: evaluate for IUP     Assess:  Intrauterine pregnancy and fetal viability   Technique:  Transabdominal obstetric (HCG+) exam      Study Limitations: body habitus and bowel gas Other findings:  Free pelvic fluid: not identified     Free peritoneal fluid: not identified   Comments:     FHR 170s Active fetal motions     Medications Ordered in ED Medications  diphenhydrAMINE (BENADRYL) injection 50 mg (50 mg Intravenous Given 10/13/22 2041)  metoCLOPramide (REGLAN) injection 10 mg (10 mg Intravenous Given 10/13/22 2041)  lactated ringers bolus 1,000 mL (1,000 mLs Intravenous New Bag/Given 10/13/22 2041)   Medical Decision Making:   Angelica Fischer is a 27 y.o. female who presented to the ED today with headache detailed above.    Patient's presentation is complicated by their history of multiple comorbid medical problems.  Patient placed on continuous vitals and  telemetry monitoring while in ED which was reviewed periodically.   Complete initial physical exam performed, notably the patient  was hemodynamically stable in no acute distress.    Reviewed and confirmed nursing documentation for past medical history, family history, social history.    Initial Assessment:   With the patient's presentation of headache, most likely diagnosis is tension type headaches vs atypical migraines. Other diagnoses were considered including (but not limited to) intracranial mass, intracranial hemorrhage, intracranial infection including meningitis vs encephalitis, GCA, trigeminal neuralgia. These are considered less likely due to history of present illness and physical exam findings.    This is most consistent with an acute life/limb threatening illness complicated by underlying chronic conditions.   Timeline and slow onset is not consistent with SAH/ICH   Age and description of pain is not consistent with GCA   Lack of fever,meningismus is not consistent with meningitis/encephalitis   Initial Plan:  Will initiate treatment with Reglan/Benardryll/Tylenol for treatment of nonspecific headache.  Had a shared medical decision making regarding pregnancy class of these medications, however patient's symptoms are severe and patient would like to proceed with these class B medications. Screening labs including CBC and Metabolic panel to evaluate for infectious or metabolic etiology of disease.  Urinalysis with reflex culture ordered to evaluate for UTI or relevant urologic/nephrologic pathology.  Objective evaluation as below reviewed   Initial Study Results:   Laboratory  All laboratory results reviewed without evidence of clinically relevant pathology.    Final Assessment and Plan:   Patient's history present on his physicals and findings are most consistent with nonspecific headache.  Patient symptoms are grossly resolved after administration of medications.  Patient will  follow-up with primary care provider in outpatient setting without acute indication for further intervention at this time. Disposition:  I have considered need for hospitalization, however, considering all of the above, I believe this patient is stable for discharge at this time.  Patient/family educated about specific return precautions for given chief complaint and symptoms.  Patient/family educated about follow-up with PCP.     Patient/family expressed understanding of return precautions and need for follow-up. Patient spoken to regarding all imaging and laboratory results and appropriate follow up for these results. All education provided in verbal form with additional information in written form. Time was allowed for answering of patient questions. Patient discharged.    Emergency Department Medication Summary:   Medications  diphenhydrAMINE (BENADRYL) injection 50 mg (50 mg Intravenous Given 10/13/22 2041)  metoCLOPramide (REGLAN) injection 10 mg (10 mg Intravenous Given 10/13/22 2041)  lactated ringers bolus 1,000 mL (1,000 mLs Intravenous New Bag/Given 10/13/22 2041)          Clinical Impression:  1. Headache disorder      Discharge   Clinical Impression:  1. Headache disorder  Discharge   Final Clinical Impression(s) / ED Diagnoses Final diagnoses:  Headache disorder    Rx / DC Orders ED Discharge Orders     None         Tretha Sciara, MD 10/13/22 2231

## 2022-10-13 NOTE — ED Notes (Signed)
Fluids were 1/3 of the way completed and pt was okay to be discharged and did not want the rest of the fluids. Fluids disconnected and PIV removed.
